# Patient Record
Sex: Male | Born: 1960 | Race: White | Hispanic: No | Marital: Married
Health system: Southern US, Community
[De-identification: ages and names within clinical notes are randomized; demographics above are authoritative.]

---

## 2014-06-20 ENCOUNTER — Ambulatory Visit: Payer: Self-pay | Admitting: Otolaryngology

## 2014-07-11 ENCOUNTER — Ambulatory Visit: Payer: Self-pay | Admitting: Otolaryngology

## 2020-02-13 ENCOUNTER — Telehealth: Payer: Self-pay | Admitting: General Practice

## 2020-02-13 NOTE — Telephone Encounter (Signed)
Received request to establish with office. Lvm asking pt to call our office to discuss this
